# Patient Record
Sex: Male | Born: 2000 | Hispanic: Yes | Marital: Single | Smoking: Never smoker
Health system: Southern US, Community
[De-identification: ages and names within clinical notes are randomized; demographics above are authoritative.]

---
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
## 2021-11-05 IMAGING — DX DG FOOT COMPLETE 3+V*L*
3 series · 3 of 3 positions shown · non-contrast
Comparison: None.

CLINICAL DATA: Twisted ankle

EXAM:
LEFT ANKLE COMPLETE - 3+ VIEW; LEFT FOOT - COMPLETE 3+ VIEW

[foot ap]
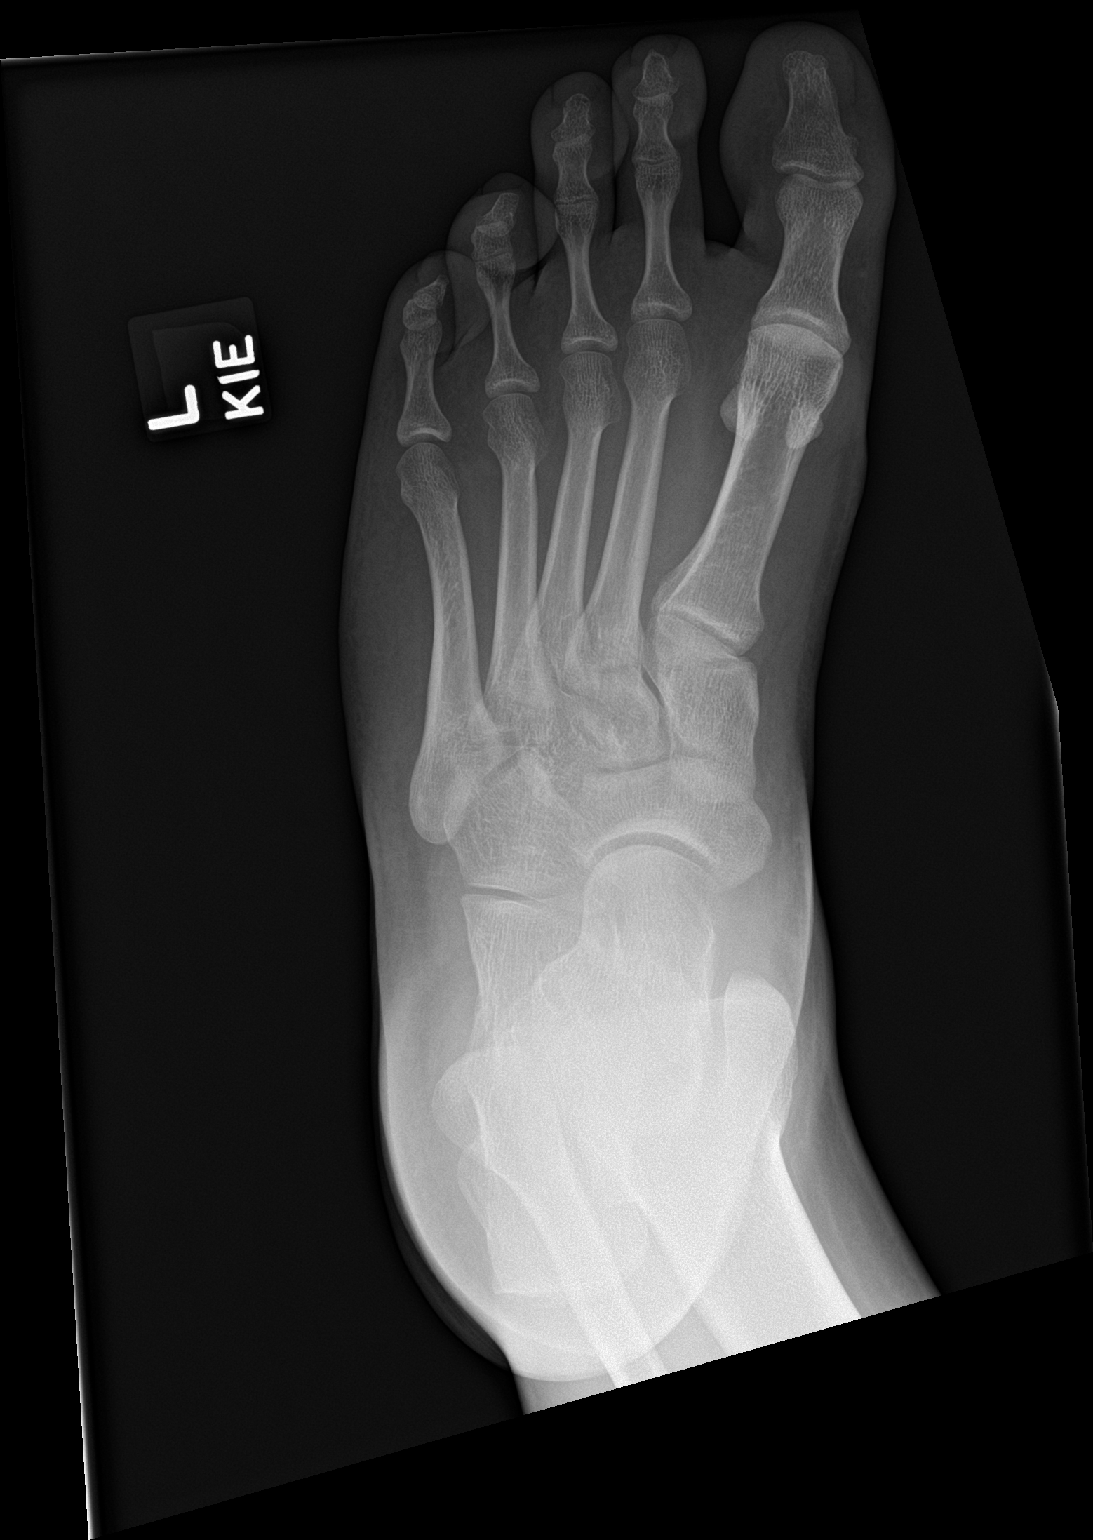

[foot obl]
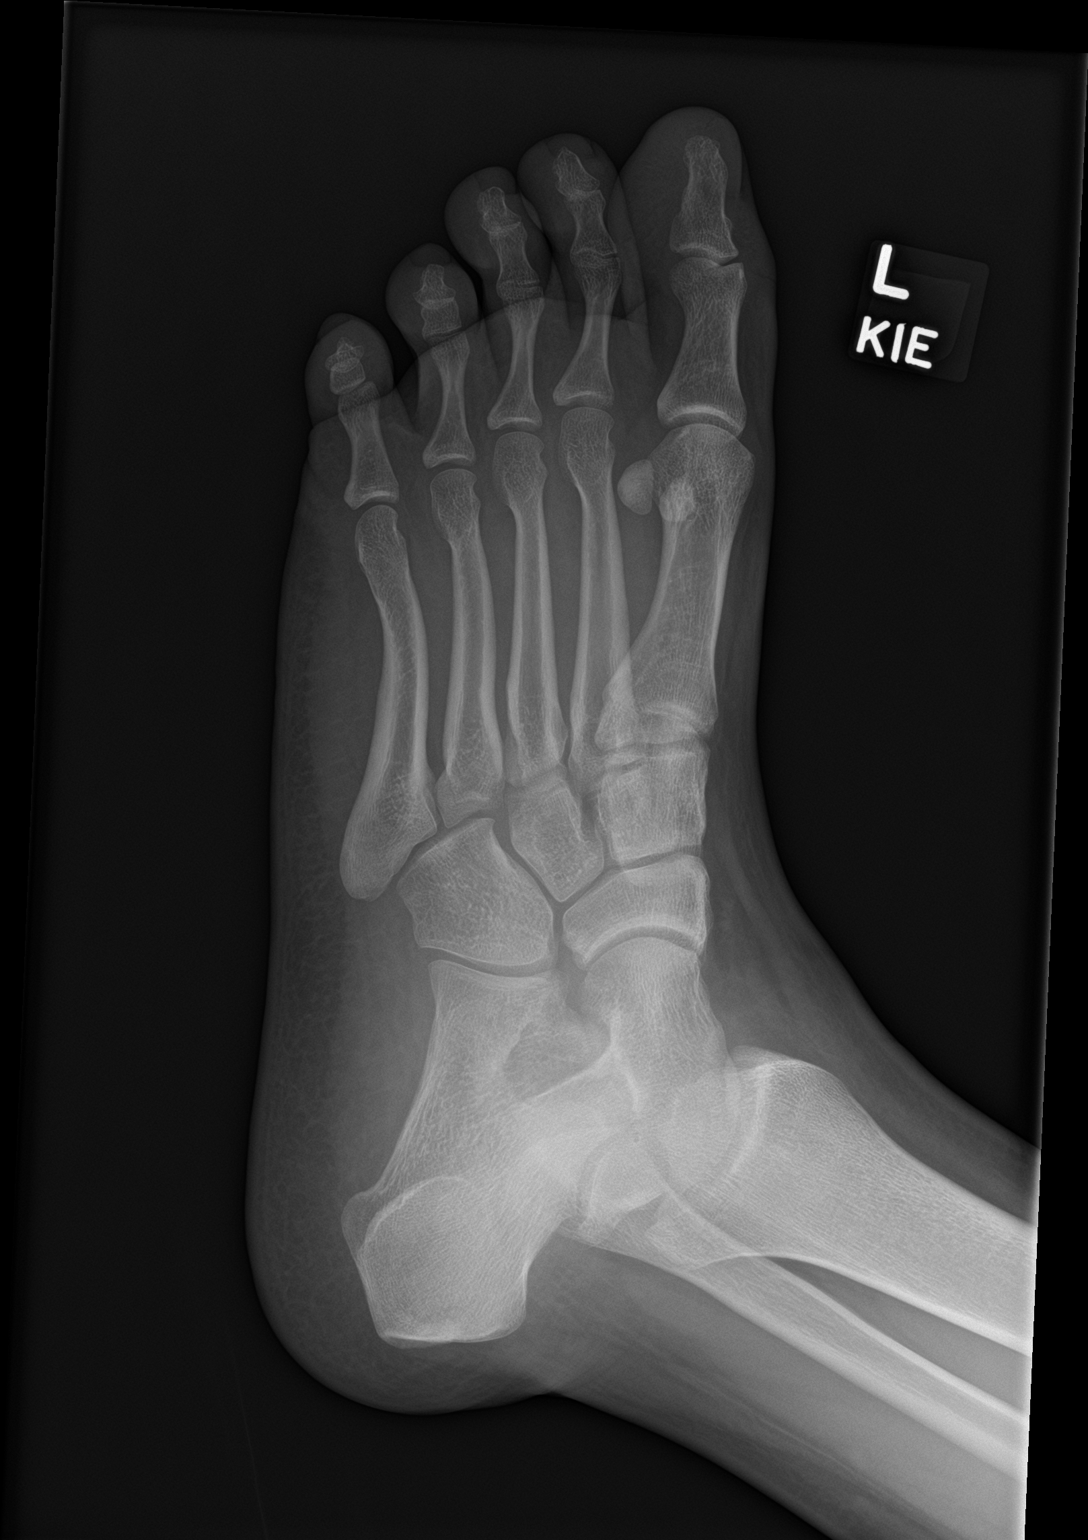

[foot lat]
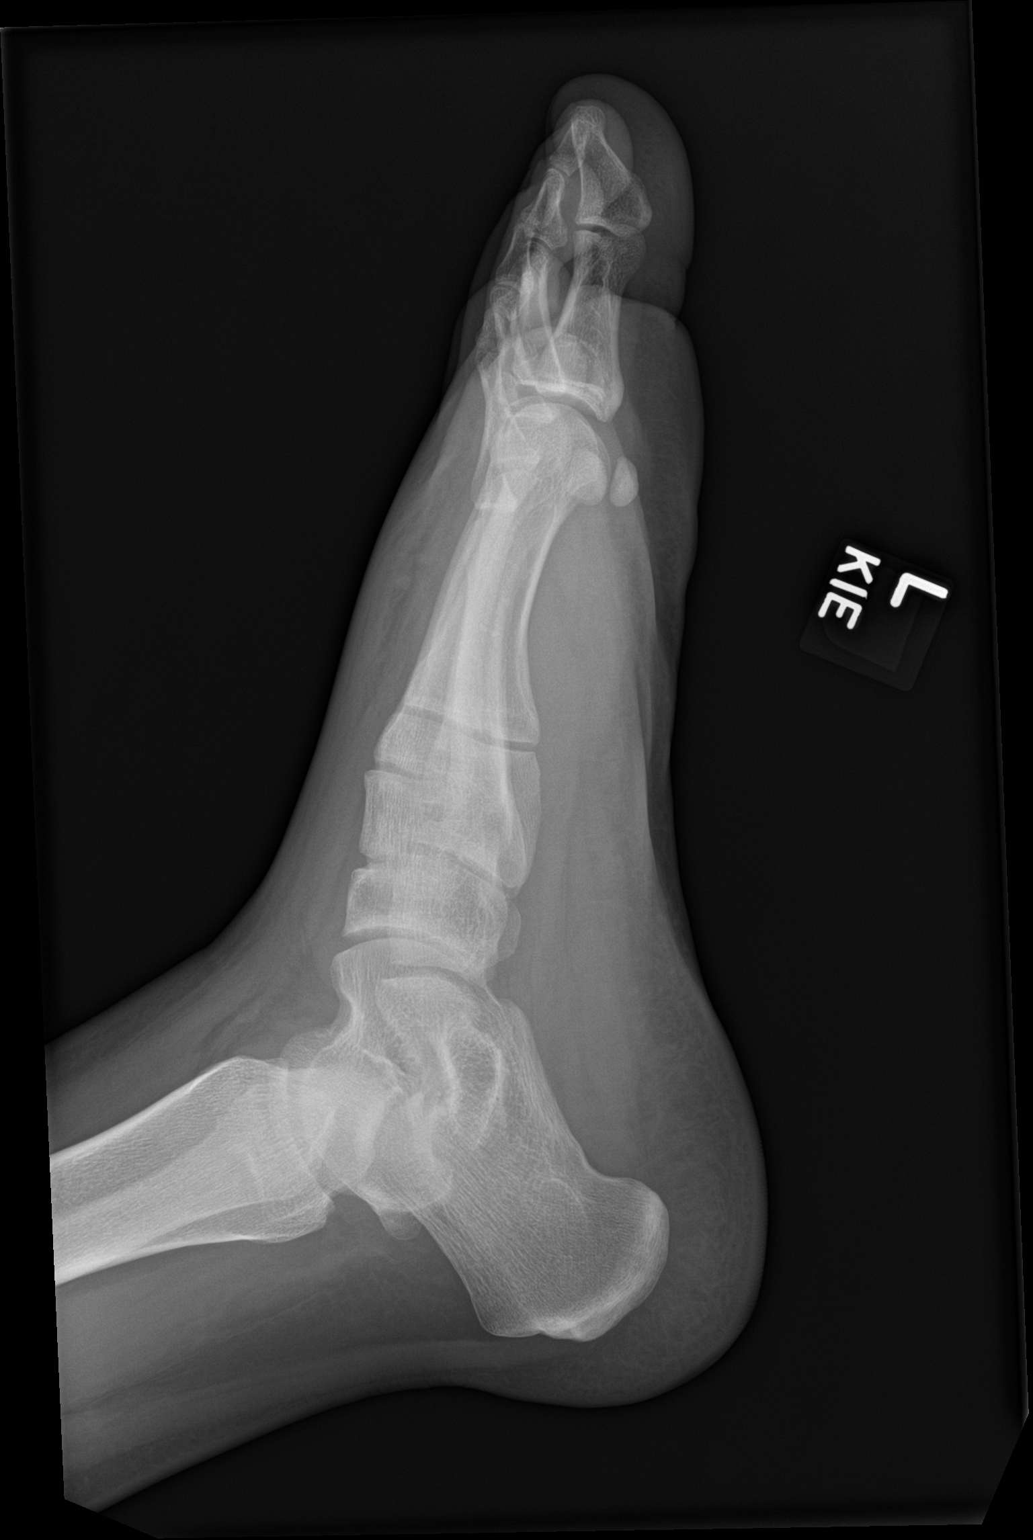

[3 of 3 positions shown; findings below may reference images not displayed]

FINDINGS: No acute fracture or dislocation. Joint spaces and alignment are
maintained. No area of erosion or osseous destruction. No unexpected
radiopaque foreign body. Soft tissue edema.
IMPRESSION: No acute fracture or dislocation.
